# Patient Record
Sex: Female | Born: 1955 | Race: Black or African American | Hispanic: No | Marital: Single | Smoking: Never smoker
Health system: Southern US, Community
[De-identification: ages and names within clinical notes are randomized; demographics above are authoritative.]

## PROBLEM LIST (undated history)

## (undated) DIAGNOSIS — M1990 Unspecified osteoarthritis, unspecified site: Secondary | ICD-10-CM

## (undated) DIAGNOSIS — I10 Essential (primary) hypertension: Secondary | ICD-10-CM

## (undated) DIAGNOSIS — E079 Disorder of thyroid, unspecified: Secondary | ICD-10-CM

## (undated) DIAGNOSIS — E119 Type 2 diabetes mellitus without complications: Secondary | ICD-10-CM

---

## 2018-01-28 ENCOUNTER — Encounter (HOSPITAL_COMMUNITY): Payer: Self-pay

## 2018-01-28 DIAGNOSIS — E119 Type 2 diabetes mellitus without complications: Secondary | ICD-10-CM | POA: Diagnosis not present

## 2018-01-28 DIAGNOSIS — I10 Essential (primary) hypertension: Secondary | ICD-10-CM | POA: Insufficient documentation

## 2018-01-28 DIAGNOSIS — R0789 Other chest pain: Secondary | ICD-10-CM | POA: Insufficient documentation

## 2018-01-28 DIAGNOSIS — M5412 Radiculopathy, cervical region: Secondary | ICD-10-CM | POA: Insufficient documentation

## 2018-01-29 ENCOUNTER — Emergency Department (HOSPITAL_COMMUNITY): Payer: Medicare Other

## 2018-01-29 ENCOUNTER — Emergency Department (HOSPITAL_COMMUNITY)
Admission: EM | Admit: 2018-01-29 | Discharge: 2018-01-29 | Disposition: A | Discharge status: Home / Self Care | Payer: Medicare Other | Attending: Emergency Medicine | Admitting: Emergency Medicine

## 2018-01-29 ENCOUNTER — Other Ambulatory Visit: Payer: Self-pay

## 2018-01-29 ENCOUNTER — Encounter (HOSPITAL_COMMUNITY): Payer: Self-pay

## 2018-01-29 DIAGNOSIS — R0789 Other chest pain: Secondary | ICD-10-CM

## 2018-01-29 DIAGNOSIS — M5412 Radiculopathy, cervical region: Secondary | ICD-10-CM

## 2018-01-29 HISTORY — DX: Unspecified osteoarthritis, unspecified site: M19.90

## 2018-01-29 HISTORY — DX: Disorder of thyroid, unspecified: E07.9

## 2018-01-29 HISTORY — DX: Essential (primary) hypertension: I10

## 2018-01-29 HISTORY — DX: Type 2 diabetes mellitus without complications: E11.9

## 2018-01-29 LAB — COMPREHENSIVE METABOLIC PANEL
ALK PHOS: 57 U/L (ref 38–126)
ALT: 20 U/L (ref 0–44)
AST: 20 U/L (ref 15–41)
Albumin: 4 g/dL (ref 3.5–5.0)
Anion gap: 11 (ref 5–15)
BUN: 23 mg/dL — ABNORMAL HIGH (ref 6–20)
CALCIUM: 8.8 mg/dL — AB (ref 8.9–10.3)
CO2: 28 mmol/L (ref 22–32)
Chloride: 102 mmol/L (ref 98–111)
Creatinine, Ser: 1.05 mg/dL — ABNORMAL HIGH (ref 0.44–1.00)
GFR calc Af Amer: >60 mL/min (ref >60)
GFR calc non Af Amer: 58 mL/min — ABNORMAL LOW (ref >60)
Glucose, Bld: 176 mg/dL — ABNORMAL HIGH (ref 70–99)
Potassium: 3.5 mmol/L (ref 3.5–5.1)
Sodium: 141 mmol/L (ref 135–145)
Total Bilirubin: 0.6 mg/dL (ref 0.3–1.2)
Total Protein: 7.2 g/dL (ref 6.5–8.1)

## 2018-01-29 LAB — CBC WITH DIFFERENTIAL/PLATELET
Abs Immature Granulocytes: 0.04 10*3/uL (ref 0.00–0.07)
Basophils Absolute: 0 10*3/uL (ref 0.0–0.1)
Basophils Relative: 0 %
Eosinophils Absolute: 0 10*3/uL (ref 0.0–0.5)
Eosinophils Relative: 0 %
HEMATOCRIT: 38.7 % (ref 36.0–46.0)
HEMOGLOBIN: 12.3 g/dL (ref 12.0–15.0)
Immature Granulocytes: 0 %
LYMPHS PCT: 19 %
Lymphs Abs: 1.8 10*3/uL (ref 0.7–4.0)
MCH: 29.5 pg (ref 26.0–34.0)
MCHC: 31.8 g/dL (ref 30.0–36.0)
MCV: 92.8 fL (ref 80.0–100.0)
Monocytes Absolute: 0.4 10*3/uL (ref 0.1–1.0)
Monocytes Relative: 4 %
Neutro Abs: 7.3 10*3/uL (ref 1.7–7.7)
Neutrophils Relative %: 77 %
Platelets: 279 10*3/uL (ref 150–400)
RBC: 4.17 MIL/uL (ref 3.87–5.11)
RDW: 13.2 % (ref 11.5–15.5)
WBC: 9.5 10*3/uL (ref 4.0–10.5)
nRBC: 0 % (ref 0.0–0.2)

## 2018-01-29 LAB — D-DIMER, QUANTITATIVE: D-Dimer, Quant: 0.71 ug/mL-FEU — ABNORMAL HIGH (ref 0.00–0.50)

## 2018-01-29 LAB — TROPONIN I
Troponin I: <0.03 ng/mL (ref <0.03)
Troponin I: <0.03 ng/mL (ref <0.03)

## 2018-01-29 MED ORDER — METHOCARBAMOL 500 MG PO TABS
1000.0000 mg | ORAL_TABLET | Freq: Once | ORAL | Status: AC
  Administered 2018-01-29: 1000 mg via ORAL
  Filled 2018-01-29: qty 2

## 2018-01-29 MED ORDER — DEXAMETHASONE SODIUM PHOSPHATE 10 MG/ML IJ SOLN
10.0000 mg | Freq: Once | INTRAMUSCULAR | Status: AC
  Administered 2018-01-29: 10 mg via INTRAVENOUS
  Filled 2018-01-29: qty 1

## 2018-01-29 MED ORDER — METHOCARBAMOL 500 MG PO TABS: 500.0000 mg | ORAL_TABLET | Freq: Four times a day (QID) | ORAL | 0 refills | Status: DC | PRN

## 2018-01-29 MED ORDER — OXYCODONE-ACETAMINOPHEN 5-325 MG PO TABS
1.0000 | ORAL_TABLET | Freq: Once | ORAL | Status: AC
  Administered 2018-01-29: 1 via ORAL
  Filled 2018-01-29: qty 1

## 2018-01-29 MED ORDER — METHYLPREDNISOLONE 4 MG PO TBPK: ORAL_TABLET | ORAL | 0 refills | Status: DC

## 2018-01-29 MED ORDER — IOPAMIDOL (ISOVUE-370) INJECTION 76%
100.0000 mL | Freq: Once | INTRAVENOUS | Status: AC | PRN
  Administered 2018-01-29: 100 mL via INTRAVENOUS

## 2018-01-29 MED ORDER — HYDROMORPHONE HCL 1 MG/ML IJ SOLN
0.5000 mg | Freq: Once | INTRAMUSCULAR | Status: AC
  Administered 2018-01-29: 0.5 mg via INTRAVENOUS
  Filled 2018-01-29: qty 1

## 2018-01-29 MED ORDER — IOPAMIDOL (ISOVUE-370) INJECTION 76%
INTRAVENOUS | Status: AC
  Filled 2018-01-29: qty 100

## 2018-01-29 MED ORDER — SODIUM CHLORIDE (PF) 0.9 % IJ SOLN
INTRAMUSCULAR | Status: AC
  Filled 2018-01-29: qty 50

## 2018-01-29 NOTE — ED Notes (Signed)
Patient transported to CT 

## 2018-01-29 NOTE — Discharge Instructions (Signed)
As we discussed your pain seems to be coming from a pinched nerve in your neck.  You declined MRI today. You should follow-up with your primary doctor and neurosurgeon when you get home.  Watch her blood sugars carefully while you are taking the steroids.  Return to the ED with worsening pain, weakness, numbness or any other concerns.

## 2018-01-29 NOTE — ED Provider Notes (Signed)
[facility]-EMERGENCY DEPT Provider Note   CSN: [id number] Arrival date & time: 01/28/18  2056     History   Chief Complaint Chief Complaint  Patient presents with  . Arm Pain    HPI Sandra Reese is a 62 y.o. female.  Patient visiting from out of town.  She has a history of diabetes, hypertension, hypothyroidism, spinal stenosis status post ACDF in 2003.  She presents with right-sided chest pain and arm pain.  This onset today around 1 PM when she woke from sleep.  She wonders if she slept on it wrong.  She was not having any pain when she went to bed.  She is been taking muscle relaxers, muscle rubs and a pain pill without relief.  The pain is worse with palpation and movement.  She denies any weakness in her arm.  She does have some tingling in her second and third fingers.  There is pain with range of motion of her neck turning to the right as well as looking down towards her chest.  She denies any fevers, chills, nausea, vomiting.  No bowel or bladder incontinence.  She reports the pain is been constant and is not improving.  States she normally does not have any radicular type pain in her arm.  The history is provided by the patient.  Arm Pain  Associated symptoms include chest pain. Pertinent negatives include no abdominal pain, no headaches and no shortness of breath.    Past Medical History:  Diagnosis Date  . Degenerative arthritis   . Diabetes mellitus without complication (HCC)   . Hypertension   . Thyroid disease     There are no active problems to display for this patient.   History reviewed. No pertinent surgical history.   OB History   No obstetric history on file.      Home Medications    Prior to Admission medications   Not on File    Family History History reviewed. No pertinent family history.  Social History Social History   Tobacco Use  . Smoking status: Never Smoker  . Smokeless tobacco: Never Used  Substance Use  Topics  . Alcohol use: Never    Frequency: Never  . Drug use: Never     Allergies   Patient has no allergy information on record.   Review of Systems Review of Systems  Constitutional: Negative for activity change, appetite change and fever.  HENT: Negative for congestion and sinus pain.   Eyes: Negative for visual disturbance.  Respiratory: Negative for chest tightness and shortness of breath.   Cardiovascular: Positive for chest pain.  Gastrointestinal: Negative for abdominal pain, nausea and vomiting.  Genitourinary: Negative for vaginal discharge.  Musculoskeletal: Positive for arthralgias, myalgias and neck pain.  Neurological: Negative for dizziness, tremors, weakness, light-headedness, numbness and headaches.    all other systems are negative except as noted in the HPI and PMH.    Physical Exam Updated Vital Signs BP (!) 169/83 (BP Location: Left Arm)   Pulse 74   Temp 98.3 F (36.8 C) (Oral)   Resp 14   Ht 5\' 6"  (1.676 m)   Wt 82.6 kg   SpO2 100%   BMI 29.38 kg/m   Physical Exam Vitals signs and nursing note reviewed.  Constitutional:      General: She is not in acute distress.    Appearance: She is well-developed.  HENT:     Head: Normocephalic and atraumatic.     Mouth/Throat:  Pharynx: No oropharyngeal exudate.  Eyes:     Conjunctiva/sclera: Conjunctivae normal.     Pupils: Pupils are equal, round, and reactive to light.  Neck:     Musculoskeletal: Normal range of motion and neck supple. Muscular tenderness present.     Comments: Right paraspinal cervical tenderness and pain with range of motion of her neck Cardiovascular:     Rate and Rhythm: Normal rate and regular rhythm.     Heart sounds: Normal heart sounds. No murmur.  Pulmonary:     Effort: Pulmonary effort is normal. No respiratory distress.     Breath sounds: Normal breath sounds.     Comments: Reproducible right chest wall tenderness Chest:     Chest wall: Tenderness present.    Abdominal:     Palpations: Abdomen is soft.     Tenderness: There is no abdominal tenderness. There is no guarding or rebound.  Musculoskeletal: Normal range of motion.        General: No tenderness.  Skin:    General: Skin is warm.  Neurological:     Mental Status: She is alert and oriented to person, place, and time.     Cranial Nerves: No cranial nerve deficit.     Motor: No abnormal muscle tone.     Coordination: Coordination normal.     Comments:  5/5 strength throughout. CN 2-12 intact.Equal grip strength.  Slightly decreased grip strength on R likely due to pain. equal flexion extension of forearms bilaterally. Cardinal hand movements intact. Intact radial pulse bilaterally Subjective paresthesias in tips of second and third fingers  Psychiatric:        Behavior: Behavior normal.      ED Treatments / Results  Labs (all labs ordered are listed, but only abnormal results are displayed) Labs Reviewed  COMPREHENSIVE METABOLIC PANEL - Abnormal; Notable for the following components:      Result Value   Glucose, Bld 176 (*)    BUN 23 (*)    Creatinine, Ser 1.05 (*)    Calcium 8.8 (*)    GFR calc non Af Amer 58 (*)    All other components within normal limits  D-DIMER, QUANTITATIVE (NOT AT [facility]) - Abnormal; Notable for the following components:   D-Dimer, Quant 0.71 (*)    All other components within normal limits  CBC WITH DIFFERENTIAL/PLATELET  TROPONIN I  TROPONIN I    EKG EKG Interpretation  Date/Time:  Friday January 29 2018 02:00:45 EST Ventricular Rate:  69 PR Interval:    QRS Duration: 96 QT Interval:  428 QTC Calculation: 459 R Axis:   -16 Text Interpretation:  Sinus rhythm Borderline left axis deviation Low voltage, precordial leads Borderline T abnormalities, anterior leads No previous ECGs available Confirmed by Glynn Octave [phone]) on 01/29/2018 2:10:23 AM Also confirmed by Glynn Octave [phone]), editor Barbette Hair [phone])  on 01/29/2018  7:29:54 AM   Radiology Ct Angio Chest Pe W And/or Wo Contrast  Result Date: 01/29/2018 CLINICAL DATA:  Acute onset of worsening right neck and arm pain. Elevated D-dimer. EXAM: CT ANGIOGRAPHY CHEST WITH CONTRAST TECHNIQUE: Multidetector CT imaging of the chest was performed using the standard protocol during bolus administration of intravenous contrast. Multiplanar CT image reconstructions and MIPs were obtained to evaluate the vascular anatomy. CONTRAST:  ISOVUE-370 IOPAMIDOL (ISOVUE-370) INJECTION 76% COMPARISON:  None. FINDINGS: Cardiovascular:  There is no evidence of pulmonary embolus. The heart is normal in size. The thoracic aorta is grossly unremarkable. The great vessels are within normal  limits. Mediastinum/Nodes: The mediastinum is grossly unremarkable in appearance. No mediastinal lymphadenopathy is seen. No pericardial effusion is identified. The thyroid gland is not characterized on this study. No axillary lymphadenopathy is seen. Lungs/Pleura: The lungs are clear bilaterally. No focal consolidation, pleural effusion or pneumothorax is seen. No masses are identified. Upper Abdomen: The visualized portions of the liver and spleen are unremarkable. The visualized portions of the pancreas, adrenal glands and kidneys are within normal limits. Musculoskeletal: No acute osseous abnormalities are identified. Cervical spinal fusion hardware is partially imaged. The visualized musculature is unremarkable in appearance. Review of the MIP images confirms the above findings. IMPRESSION: 1. No evidence of pulmonary embolus. 2. Lungs clear bilaterally. Electronically Signed   By: Roanna RaiderJeffery  Chang M.D.   On: 01/29/2018 04:49   Ct Cervical Spine Wo Contrast  Result Date: 01/29/2018 CLINICAL DATA:  Chronic back pain. EXAM: CT CERVICAL SPINE WITHOUT CONTRAST TECHNIQUE: Multidetector CT imaging of the cervical spine was performed without intravenous contrast. Multiplanar CT image reconstructions were also  generated. COMPARISON:  None. FINDINGS: Alignment: Normal. Skull base and vertebrae: No acute fracture. Status post anterior cervical spinal fusion at C5-C6. No primary bone lesion or focal pathologic process. Soft tissues and spinal canal: No prevertebral fluid or swelling. No visible canal hematoma. Disc levels: Intervertebral disc spaces are otherwise grossly preserved. Scattered anterior disc osteophyte complexes are seen along the lower cervical spine. Facet disease is noted at the upper and mid cervical spine. Upper chest: The visualized lung apices are clear. The thyroid is not well characterized. Other: No additional soft tissue abnormalities are seen. IMPRESSION: 1. No evidence of fracture or subluxation along the cervical spine. 2. Status post anterior cervical spinal fusion at C5-C6. Electronically Signed   By: Roanna RaiderJeffery  Chang M.D.   On: 01/29/2018 02:52    Procedures Procedures (including critical care time)  Medications Ordered in ED Medications  oxyCODONE-acetaminophen (PERCOCET/ROXICET) 5-325 MG per tablet 1 tablet (has no administration in time range)     Initial Impression / Assessment and Plan / ED Course  I have reviewed the triage vital signs and the nursing notes.  Pertinent labs & imaging results that were available during my care of the patient were reviewed by me and considered in my medical decision making (see chart for details).    Patient with right-sided chest and arm pain since about 1 PM and is been constant.  Mildly decreased strength in right hand due to pain.  Subjective tingling in her second and third digits.  History of C-spine surgery remotely.  MRI 07/09/17 IMPRESSION: 1. ACDF AT C5-6. 2. MINIMAL ANTEROLISTHESIS AT C3-4. BROAD CENTRAL DISC PROTRUSION WITH SLIGHT VENTRAL CORD FLATTENING AT THIS LEVEL. 3. BROAD CENTRAL/RIGHT PARACENTRAL DISC PROTRUSION WITH MILD RIGHT VENTRAL CORD FLATTENING AT C4-5. MILD RIGHT-SIDED FORAMINAL STENOSIS AT THIS LEVEL. 4.  BROAD DISC/OSTEOPHYTE COMPLEX WITH VENTRAL CORD FLATTENING AT C6-7. MILD RIGHT-SIDED FORAMINAL STENOSIS AT THIS LEVEL.   Suspect MSK chest pain, and likely cervical radiculopathy. EKG with T wave changes, no comparison. Troponin negative.   CT C spine today nonacute. Troponin negative x2.  Pain improved in the ED with improved grip strength on R and now symmetric upper extremity strength. Suspect cervical radiculopathy. MRI not available, patient offered transfer for MRI and declines. Low suspicion for cord compression. She has good strength and sensation in her arm. No fever or other red flags.  States she will followup with her doctors for MRI.Will treat with course of steroids and muscle relaxers. Advised  to monitor blood sugars closely while on steroids. Return precautions discussed.  Final Clinical Impressions(s) / ED Diagnoses   Final diagnoses:  Cervical radiculopathy  Atypical chest pain    ED Discharge Orders    None       Toris Laverdiere, Jeannett SeniorStephen, MD 01/29/18 [phone]

## 2019-11-29 IMAGING — CT CT ANGIO CHEST
2 of 7 series · 18 of 46 positions shown · IV contrast (ISOVUE 370)
Comparison: None.

CLINICAL DATA: Acute onset of worsening right neck and arm pain.
Elevated D-dimer.

EXAM:
CT ANGIOGRAPHY CHEST WITH CONTRAST
TECHNIQUE: Multidetector CT imaging of the chest was performed using the
standard protocol during bolus administration of intravenous
contrast. Multiplanar CT image reconstructions and MIPs were
obtained to evaluate the vascular anatomy.
CONTRAST:  100mL FWJ23C-PBM IOPAMIDOL (FWJ23C-PBM) INJECTION 76%

[Series 5: thins · axial · 0.62mm/px · z∈[+1524,+1736]mm · 15 of 241 slices shown]
[im 15/241  lung]
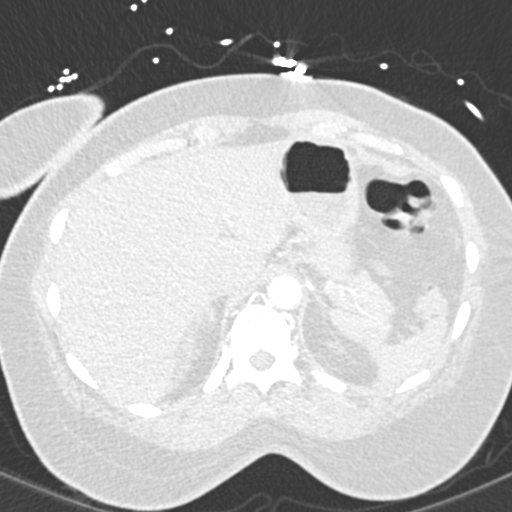
[im 29/241  soft-tissue]
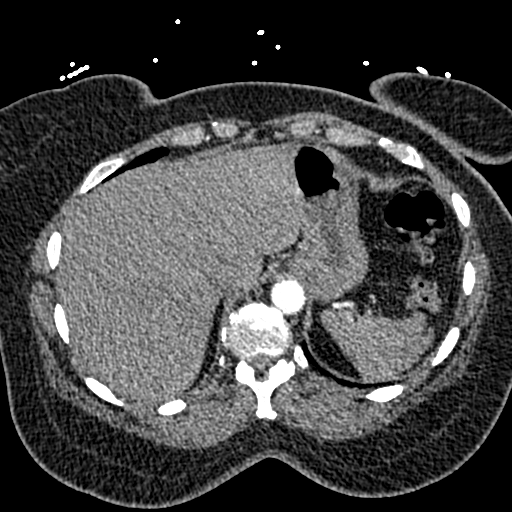
[im 43/241  lung]
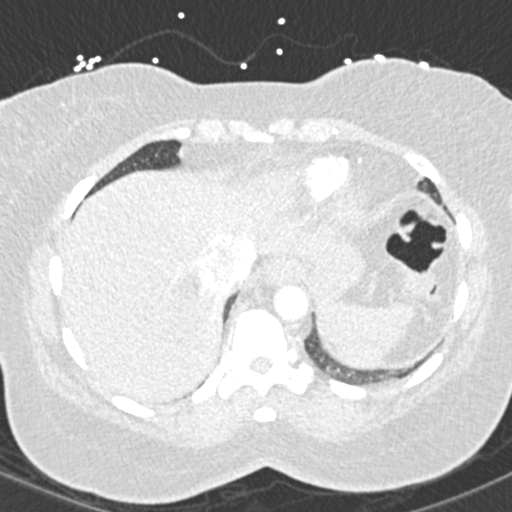
[im 57/241  soft-tissue]
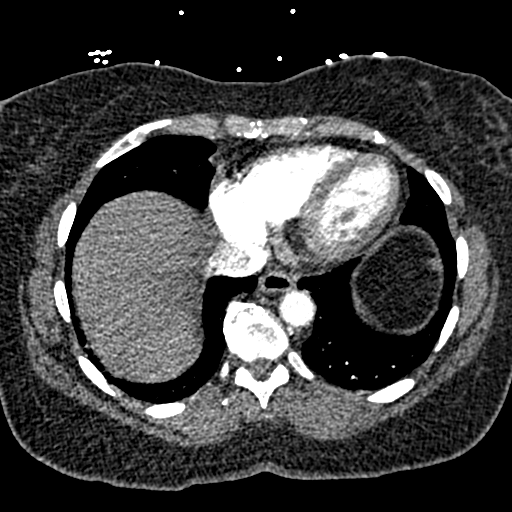
[im 71/241  lung]
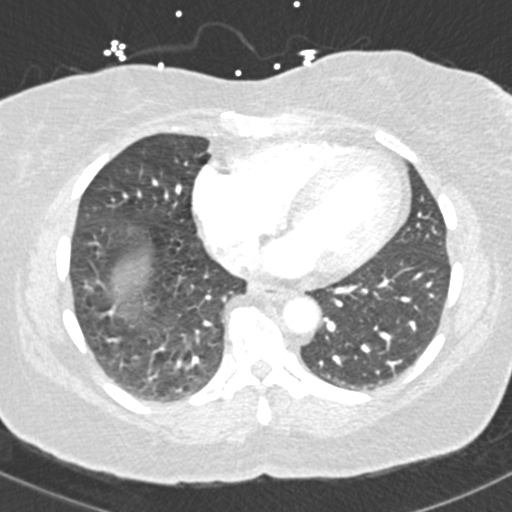
[im 85/241  soft-tissue]
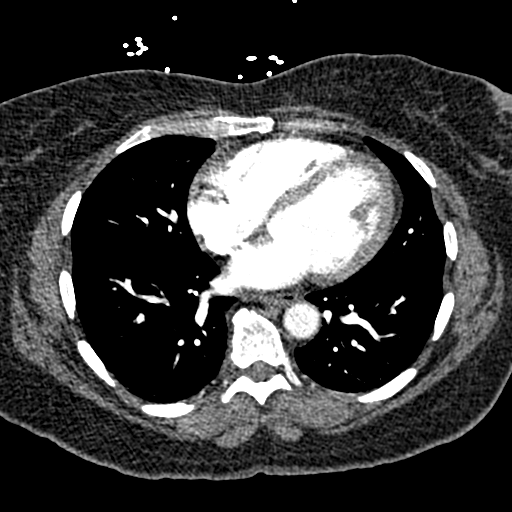
[im 99/241  lung]
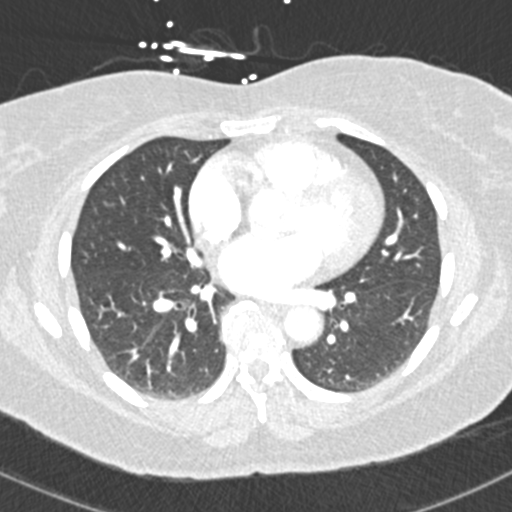
[im 128/241  soft-tissue]
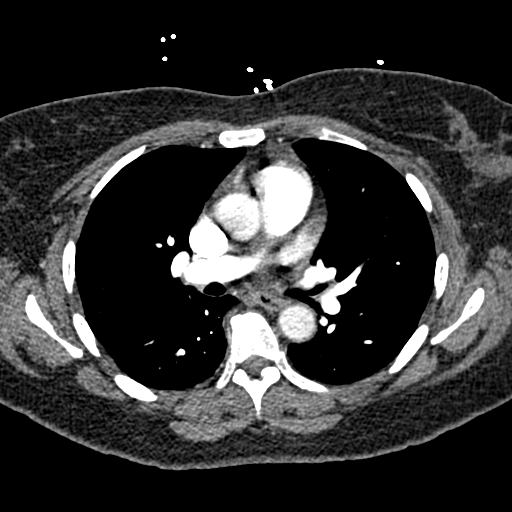
[im 142/241  lung]
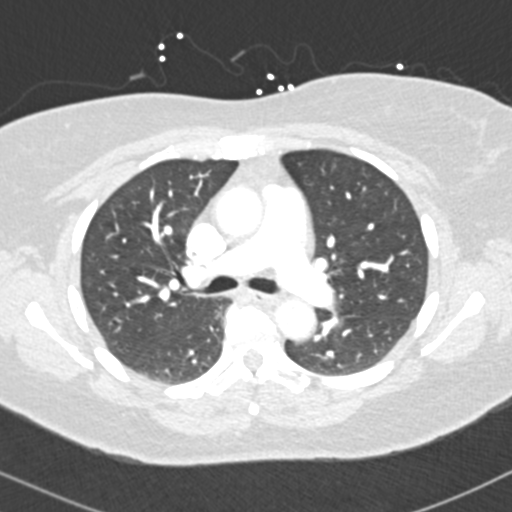
[im 156/241  soft-tissue]
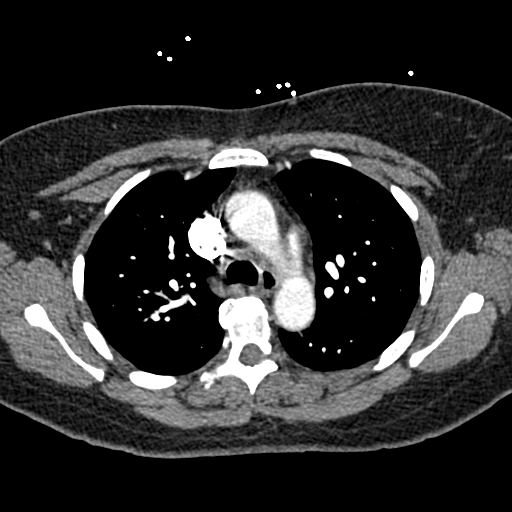
[im 170/241  lung]
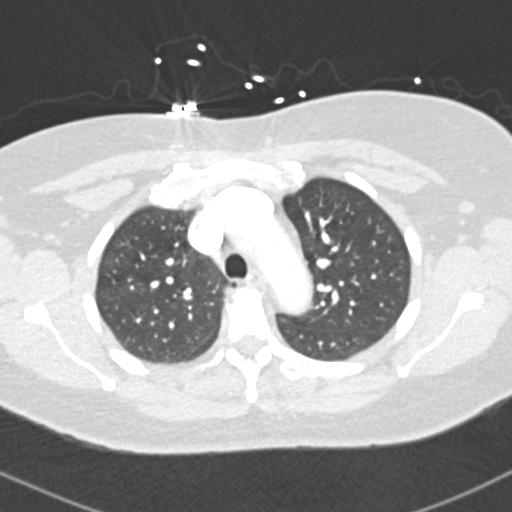
[im 184/241  soft-tissue]
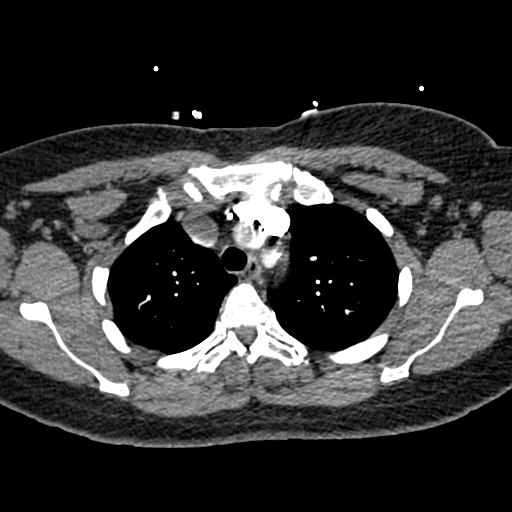
[im 198/241  lung]
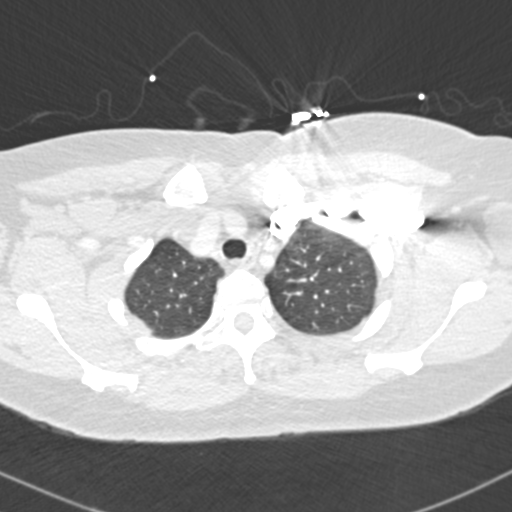
[im 212/241  soft-tissue]
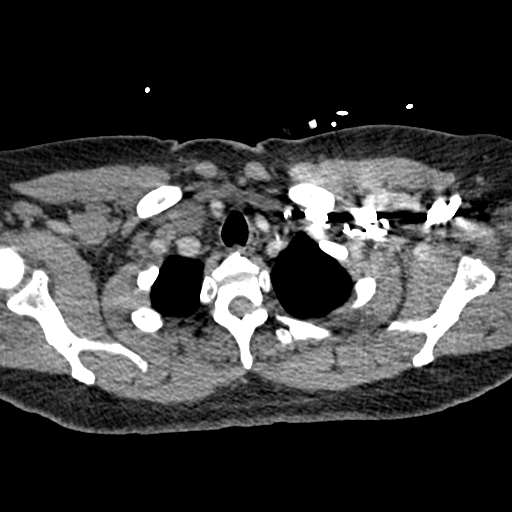
[im 226/241  lung]
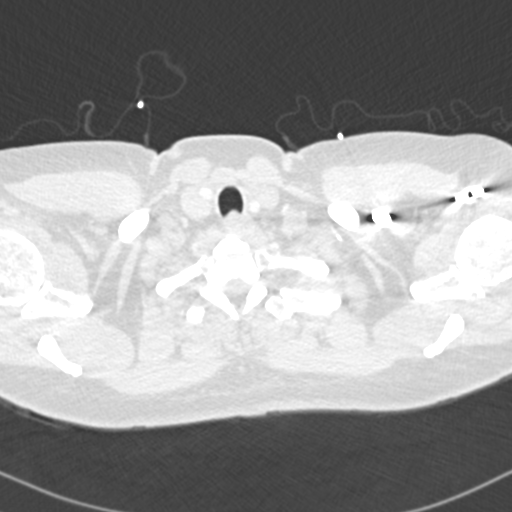

[Series 7: coronal mpr · coronal · 0.47mm/px · 3 of 111 slices shown]
[im 28/111  soft-tissue]
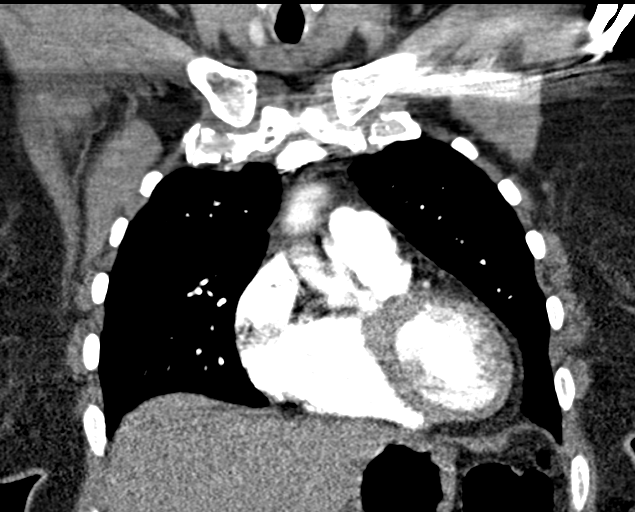
[im 56/111  soft-tissue]
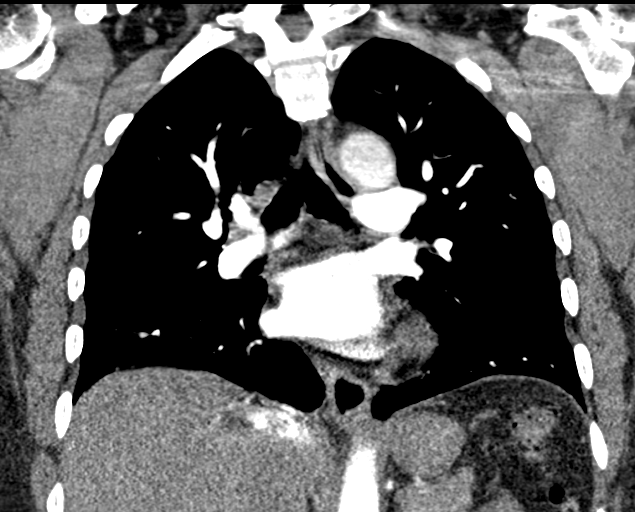
[im 83/111  soft-tissue]
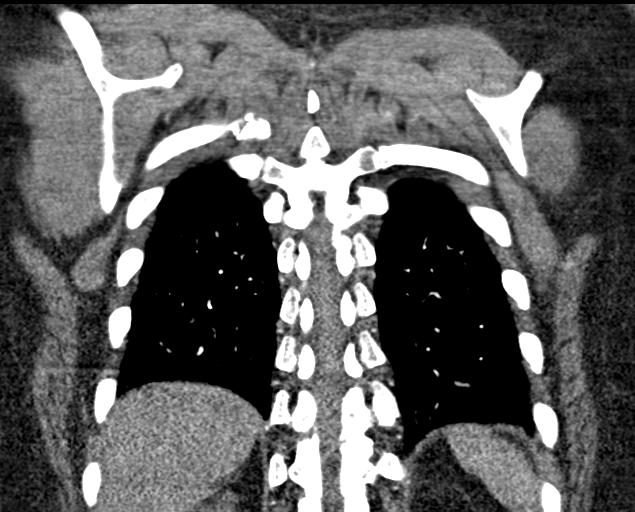

[18 of 46 positions shown; findings below may reference images not displayed]

FINDINGS: Cardiovascular:  There is no evidence of pulmonary embolus.

The heart is normal in size. The thoracic aorta is grossly
unremarkable. The great vessels are within normal limits.

Mediastinum/Nodes: The mediastinum is grossly unremarkable in
appearance. No mediastinal lymphadenopathy is seen. No pericardial
effusion is identified. The thyroid gland is not characterized on
this study. No axillary lymphadenopathy is seen.

Lungs/Pleura: The lungs are clear bilaterally. No focal
consolidation, pleural effusion or pneumothorax is seen. No masses
are identified.

Upper Abdomen: The visualized portions of the liver and spleen are
unremarkable. The visualized portions of the pancreas, adrenal
glands and kidneys are within normal limits.

Musculoskeletal: No acute osseous abnormalities are identified.
Cervical spinal fusion hardware is partially imaged. The visualized
musculature is unremarkable in appearance.

Review of the MIP images confirms the above findings.
IMPRESSION: 1. No evidence of pulmonary embolus.
2. Lungs clear bilaterally.

## 2023-01-29 ENCOUNTER — Ambulatory Visit
Admission: RE | Admit: 2023-01-29 | Discharge: 2023-01-29 | Disposition: A | Discharge status: Home / Self Care | Payer: Medicare PPO | Source: Ambulatory Visit

## 2023-01-29 VITALS — BP 118/75 | HR 71 | Temp 97.6°F | Resp 18

## 2023-01-29 DIAGNOSIS — E1142 Type 2 diabetes mellitus with diabetic polyneuropathy: Secondary | ICD-10-CM

## 2023-01-29 DIAGNOSIS — Z20828 Contact with and (suspected) exposure to other viral communicable diseases: Secondary | ICD-10-CM

## 2023-01-29 LAB — POCT INFLUENZA A/B
Influenza A, POC: NEGATIVE
Influenza B, POC: NEGATIVE

## 2023-01-29 MED ORDER — OSELTAMIVIR PHOSPHATE 75 MG PO CAPS: 75.0000 mg | ORAL_CAPSULE | Freq: Two times a day (BID) | ORAL | 0 refills | Status: AC

## 2023-01-29 NOTE — ED Provider Notes (Signed)
[facility]    CSN: [id number] Arrival date & time: 01/29/23  1351    HISTORY   Chief Complaint  Patient presents with   Leg Pain    Entered by patient   HPI Sandra Reese is a pleasant, 67 y.o. female who presents to urgent care today. Patient complains of nerve pain in both of her lower legs that is worse at bedtime.  Patient states she is a diabetic.  Patient states has been taking Tylenol but it does not help.  Patient states this began about a week ago.  EMR reviewed, patient has been prescribed Percocet and Lyrica for similar complaints, both last filled earlier this month.  Patient states she has not been taking either 1 because they make her very sleepy.  Patient also complains of exposure to influenza A from a member of her household, denies symptoms of influenza at this time.  The history is provided by the patient.   Past Medical History:  Diagnosis Date   Degenerative arthritis    Diabetes mellitus without complication (HCC)    Hypertension    Thyroid disease    There are no active problems to display for this patient.  History reviewed. No pertinent surgical history. OB History   No obstetric history on file.    Home Medications    Prior to Admission medications   Medication Sig Start Date End Date Taking? Authorizing Provider  estradiol (ESTRACE) 1 MG tablet Take 1 mg by mouth daily. 11/27/22  Yes [provider]  oseltamivir (TAMIFLU) 75 MG capsule Take 1 capsule (75 mg total) by mouth every 12 (twelve) hours for 10 days. 01/29/23 02/08/23 Yes Theadora Rama Scales, PA-C  OZEMPIC, 1 MG/DOSE, 4 MG/3ML SOPN inject 1mg  Subcutaneous Once a Week for 90 days 01/06/22 02/25/23 Yes [provider]  atenolol (TENORMIN) 50 MG tablet Take 50 mg by mouth as directed. Every other day    [provider]  atenolol-chlorthalidone (TENORETIC) 50-25 MG tablet Take 1 tablet by mouth as directed. Every other day    [provider]   levothyroxine (SYNTHROID, LEVOTHROID) 175 MCG tablet Take 175 mcg by mouth daily before breakfast.    [provider]  oxyCODONE-acetaminophen (PERCOCET) 10-325 MG tablet Take 1 tablet by mouth every 6 (six) hours as needed for pain.     [provider]  pregabalin (LYRICA) 150 MG capsule Take 150 mg by mouth 3 (three) times daily.    [provider]  rosuvastatin (CRESTOR) 10 MG tablet Take 10 mg by mouth 2 (two) times a week.    [provider]  traZODone (DESYREL) 50 MG tablet Take 50-100 mg by mouth at bedtime.    [provider]    Family History History reviewed. No pertinent family history. Social History Social History   Tobacco Use   Smoking status: Never   Smokeless tobacco: Never  Substance Use Topics   Alcohol use: Never   Drug use: Never   Allergies   Lisinopril, Latex, and Metronidazole  Review of Systems Review of Systems Pertinent findings revealed after performing a 14 point review of systems has been noted in the history of present illness.  Physical Exam Vital Signs BP 118/75 (BP Location: Right Arm)   Pulse 71   Temp 97.6 F (36.4 C) (Oral)   Resp 18   SpO2 98%   No data found.  Physical Exam Vitals and nursing note reviewed.  Constitutional:      General: She is not  in acute distress.    Appearance: Normal appearance. She is not ill-appearing.  HENT:     Head: Normocephalic and atraumatic.     Salivary Glands: Right salivary gland is not diffusely enlarged or tender. Left salivary gland is not diffusely enlarged or tender.     Right Ear: Tympanic membrane, ear canal and external ear normal. No drainage. No middle ear effusion. There is no impacted cerumen. Tympanic membrane is not erythematous or bulging.     Left Ear: Tympanic membrane, ear canal and external ear normal. No drainage.  No middle ear effusion. There is no impacted cerumen. Tympanic membrane is not erythematous or bulging.     Nose: Nose  normal. No nasal deformity, septal deviation, mucosal edema, congestion or rhinorrhea.     Right Turbinates: Not enlarged, swollen or pale.     Left Turbinates: Not enlarged, swollen or pale.     Right Sinus: No maxillary sinus tenderness or frontal sinus tenderness.     Left Sinus: No maxillary sinus tenderness or frontal sinus tenderness.     Mouth/Throat:     Lips: Pink. No lesions.     Mouth: Mucous membranes are moist. No oral lesions.     Pharynx: Oropharynx is clear. Uvula midline. No posterior oropharyngeal erythema or uvula swelling.     Tonsils: No tonsillar exudate. 0 on the right. 0 on the left.  Eyes:     General: Lids are normal.        Right eye: No discharge.        Left eye: No discharge.     Extraocular Movements: Extraocular movements intact.     Conjunctiva/sclera: Conjunctivae normal.     Right eye: Right conjunctiva is not injected.     Left eye: Left conjunctiva is not injected.  Neck:     Trachea: Trachea and phonation normal.  Cardiovascular:     Rate and Rhythm: Normal rate and regular rhythm.     Pulses: Normal pulses.     Heart sounds: Normal heart sounds. No murmur heard.    No friction rub. No gallop.  Pulmonary:     Effort: Pulmonary effort is normal. No accessory muscle usage, prolonged expiration or respiratory distress.     Breath sounds: Normal breath sounds. No stridor, decreased air movement or transmitted upper airway sounds. No decreased breath sounds, wheezing, rhonchi or rales.  Chest:     Chest wall: No tenderness.  Musculoskeletal:        General: No swelling, tenderness, deformity or signs of injury. Normal range of motion.     Cervical back: Normal range of motion and neck supple. Normal range of motion.     Right lower leg: No edema.     Left lower leg: No edema.  Lymphadenopathy:     Cervical: No cervical adenopathy.  Skin:    General: Skin is warm and dry.     Findings: No erythema or rash.  Neurological:     General: No focal  deficit present.     Mental Status: She is alert and oriented to person, place, and time.  Psychiatric:        Mood and Affect: Mood normal.        Behavior: Behavior normal.     Visual Acuity Right Eye Distance:   Left Eye Distance:   Bilateral Distance:    Right Eye Near:   Left Eye Near:    Bilateral Near:     UC Couse / Diagnostics / Procedures:  Radiology No results found.  Procedures Procedures (including critical care time) EKG  Pending results:  Labs Reviewed  POCT INFLUENZA A/B - Normal    Medications Ordered in UC: Medications - No data to display  UC Diagnoses / Final Clinical Impressions(s)   I have reviewed the triage vital signs and the nursing notes.  Pertinent labs & imaging results that were available during my care of the patient were reviewed by me and considered in my medical decision making (see chart for details).    Final diagnoses:  Exposure to influenza  Diabetic peripheral neuropathy (HCC)   Influenza test today is negative.  Due to patient having ongoing exposure to family member with influenza A, recommend 10-day course of Tamiflu.  Patient advised that if she develops symptoms of influenza, she would need only complete 5 days of Tamiflu from the date of symptom onset.  Patient advised to resume Lyrica as needed for lower extremity pain and to add Percocet as needed for pain not relieved by Lyrica.  Conservative care recommended.  Return precautions advised.  Please see discharge instructions below for details of plan of care as provided to patient. ED Prescriptions     Medication Sig Dispense Auth. Provider   oseltamivir (TAMIFLU) 75 MG capsule Take 1 capsule (75 mg total) by mouth every 12 (twelve) hours for 10 days. 20 capsule Theadora Rama Scales, PA-C      I have reviewed the PDMP during this encounter.  Pending results:  Labs Reviewed  POCT INFLUENZA A/B - Normal      Discharge Instructions      Please resume  Lyrica at bedtime to treat your leg pain.  Your rapid influenza antigen test today was negative.   To prevent you from developing influenza infection caused by known exposure to influenza A, please begin taking Tamiflu twice daily.  If you develop symptoms of influenza, you will only need to take the Tamiflu for 5 full days after the day your symptoms begin.  If you do not develop symptoms of influenza, please take Tamiflu for the full 10 days as prescribed.  Other members of your household, whether they are feeling ill or not, should also be tested and treated for influenza as well.  We are happy to see them here at this location where they can follow-up with her regular providers.  Thank you for visiting [facility] today.  We appreciate the opportunity to participate in your care.       Disposition Upon Discharge:  Condition: stable for discharge home  Patient presented with an acute illness with associated systemic symptoms and significant discomfort requiring urgent management. In my opinion, this is a condition that a prudent lay person (someone who possesses an average knowledge of health and medicine) may potentially expect to result in complications if not addressed urgently such as respiratory distress, impairment of bodily function or dysfunction of bodily organs.   Routine symptom specific, illness specific and/or disease specific instructions were discussed with the patient and/or caregiver at length.   As such, the patient has been evaluated and assessed, work-up was performed and treatment was provided in alignment with urgent care protocols and evidence based medicine.  Patient/parent/caregiver has been advised that the patient may require follow up for further testing and treatment if the symptoms continue in spite of treatment, as clinically indicated and appropriate.  Patient/parent/caregiver has been advised to return to the [facility] or PCP if no better; to PCP or  the Emergency  Department if new signs and symptoms develop, or if the current signs or symptoms continue to change or worsen for further workup, evaluation and treatment as clinically indicated and appropriate  The patient will follow up with their current PCP if and as advised. If the patient does not currently have a PCP we will assist them in obtaining one.   The patient may need specialty follow up if the symptoms continue, in spite of conservative treatment and management, for further workup, evaluation, consultation and treatment as clinically indicated and appropriate.  Patient/parent/caregiver verbalized understanding and agreement of plan as discussed.  All questions were addressed during visit.  Please see discharge instructions below for further details of plan.  This office note has been dictated using Teaching laboratory technician.  Unfortunately, this method of dictation can sometimes lead to typographical or grammatical errors.  I apologize for your inconvenience in advance if this occurs.  Please do not hesitate to reach out to me if clarification is needed.      Theadora Rama Scales, PA-C 01/29/23 1539

## 2023-01-29 NOTE — ED Triage Notes (Signed)
Pt c/o bilateral lower extremity nerve pain that is worse at bedtime. Patient states she is a diabetic.   Home interventions: tylenol  Started: about a week ago

## 2023-01-29 NOTE — Discharge Instructions (Addendum)
Please resume Lyrica at bedtime to treat your leg pain.  Your rapid influenza antigen test today was negative.   To prevent you from developing influenza infection caused by known exposure to influenza A, please begin taking Tamiflu twice daily.  If you develop symptoms of influenza, you will only need to take the Tamiflu for 5 full days after the day your symptoms begin.  If you do not develop symptoms of influenza, please take Tamiflu for the full 10 days as prescribed.  Other members of your household, whether they are feeling ill or not, should also be tested and treated for influenza as well.  We are happy to see them here at this location where they can follow-up with her regular providers.  Thank you for visiting [facility] today.  We appreciate the opportunity to participate in your care.

## 2023-11-04 ENCOUNTER — Ambulatory Visit: Admitting: Radiology

## 2023-11-04 ENCOUNTER — Ambulatory Visit
Admission: RE | Admit: 2023-11-04 | Discharge: 2023-11-04 | Disposition: A | Discharge status: Home / Self Care | Source: Ambulatory Visit

## 2023-11-04 VITALS — BP 109/67 | HR 81 | Temp 98.1°F | Resp 18

## 2023-11-04 DIAGNOSIS — S39012A Strain of muscle, fascia and tendon of lower back, initial encounter: Secondary | ICD-10-CM

## 2023-11-04 MED ORDER — BACLOFEN 10 MG PO TABS: 10.0000 mg | ORAL_TABLET | Freq: Two times a day (BID) | ORAL | 0 refills | Status: AC

## 2023-11-04 MED ORDER — KETOROLAC TROMETHAMINE 30 MG/ML IJ SOLN
30.0000 mg | Freq: Once | INTRAMUSCULAR | Status: AC
  Administered 2023-11-04: 30 mg via INTRAMUSCULAR

## 2023-11-04 MED ORDER — DICLOFENAC SODIUM 50 MG PO TBEC: 50.0000 mg | DELAYED_RELEASE_TABLET | Freq: Two times a day (BID) | ORAL | 1 refills | Status: AC

## 2023-11-04 NOTE — ED Provider Notes (Signed)
 [facility]  Note:  This document was prepared using Dragon voice recognition software and may include unintentional dictation errors.  MRN: [id number] DOB: July 15, 1955  Subjective:   Sandra Reese is a 68 y.o. female presenting for bilateral lower back pain x 1.5 weeks.  Patient reports that she was working in the garden when she fell landing on her left hip on the sidewalk.  Patient reports since that time she has had lower back pain and bruising to the left hip.  Patient states that left hip pain has subsided but she is still having bilateral lower back pain.  Patient has chronic history of lower back pain and degenerative disc disease.  Patient denies hitting her head or any loss of consciousness.  Patient reports that she is still having severe pain with lateral and rotational movement.  Patient has been taking all of her previously prescribed pain reliever and muscle relaxants with no improvement.  Patient states that she was given a prescription on paper by her physician back home lumbar x-ray but was unable to complete exam prior to leaving to come visit her daughter here in [place].  Patient states that she expresses some concern for lumbar vertebral fracture due to fall.  No current facility-administered medications for this encounter.  Current Outpatient Medications:    baclofen (LIORESAL) 10 MG tablet, Take 1 tablet (10 mg total) by mouth 2 (two) times daily., Disp: 30 each, Rfl: 0   diclofenac (VOLTAREN) 50 MG EC tablet, Take 1 tablet (50 mg total) by mouth 2 (two) times daily., Disp: 30 tablet, Rfl: 1   atenolol (TENORMIN) 50 MG tablet, Take 50 mg by mouth as directed. Every other day, Disp: , Rfl:    atenolol-chlorthalidone (TENORETIC) 50-25 MG tablet, Take 1 tablet by mouth as directed. Every other day, Disp: , Rfl:    estradiol (ESTRACE) 1 MG tablet, Take 1 mg by mouth daily., Disp: , Rfl:    levothyroxine (SYNTHROID, LEVOTHROID) 175 MCG tablet, Take 175 mcg by  mouth daily before breakfast., Disp: , Rfl:    oxyCODONE -acetaminophen  (PERCOCET) 10-325 MG tablet, Take 1 tablet by mouth every 6 (six) hours as needed for pain. , Disp: , Rfl:    pregabalin (LYRICA) 150 MG capsule, Take 150 mg by mouth 3 (three) times daily., Disp: , Rfl:    rosuvastatin (CRESTOR) 10 MG tablet, Take 10 mg by mouth 2 (two) times a week., Disp: , Rfl:    traZODone (DESYREL) 50 MG tablet, Take 50-100 mg by mouth at bedtime., Disp: , Rfl:    Allergies  Allergen Reactions   Lisinopril     Other reaction(s): Swelling   Latex     Other reaction(s): Itching, Rash   Metronidazole Itching    Past Medical History:  Diagnosis Date   Degenerative arthritis    Diabetes mellitus without complication (HCC)    Hypertension    Thyroid disease      History reviewed. No pertinent surgical history.  History reviewed. No pertinent family history.  Social History   Tobacco Use   Smoking status: Never   Smokeless tobacco: Never  Substance Use Topics   Alcohol use: Never   Drug use: Never    ROS Refer to HPI for ROS details.  Objective:   Vitals: BP 109/67 (BP Location: Right Arm)   Pulse 81   Temp 98.1 F (36.7 C) (Oral)   Resp 18   SpO2 98%   Physical Exam Vitals and nursing note reviewed.  Constitutional:  General: She is not in acute distress.    Appearance: Normal appearance. She is well-developed. She is not ill-appearing or toxic-appearing.  HENT:     Head: Normocephalic and atraumatic.  Cardiovascular:     Rate and Rhythm: Normal rate.  Pulmonary:     Effort: Pulmonary effort is normal. No respiratory distress.  Musculoskeletal:     Lumbar back: Spasms, tenderness and bony tenderness present. No swelling or deformity. Decreased range of motion. Negative right straight leg raise test and negative left straight leg raise test.  Skin:    General: Skin is warm and dry.  Neurological:     General: No focal deficit present.     Mental Status: She is  alert and oriented to person, place, and time.  Psychiatric:        Mood and Affect: Mood normal.        Behavior: Behavior normal.     Procedures  No results found for this or any previous visit (from the past 24 hours).  DG Lumbar Spine Complete Result Date: 11/04/2023 CLINICAL DATA:  Lower back pain after fall 2 weeks ago. EXAM: LUMBAR SPINE - COMPLETE 4+ VIEW COMPARISON:  None Available. FINDINGS: Mild grade 1 anterolisthesis of L3-4 and L4-5 is noted secondary to posterior facet joint hypertrophy. Minimal degenerative disc disease is noted at L3-4, L4-5 and L5-S1. No fracture is noted. IMPRESSION: Multilevel degenerative changes as described above. No acute abnormality seen. Electronically Signed   By: Lynwood Landy Raddle M.D.   On: 11/04/2023 16:15     Assessment and Plan :     Discharge Instructions       1. Lumbar strain, initial encounter (Primary) - ketorolac (TORADOL) 30 MG/ML injection 30 mg given in UC for acute lumbosacral pain - DG Lumbar Spine Complete x-ray completed today shows no acute fracture or dislocation, moderate multilevel degenerative changes noted. - diclofenac (VOLTAREN) 50 MG EC tablet; Take 1 tablet (50 mg total) by mouth 2 (two) times daily.  Dispense: 30 tablet; Refill: 1 - baclofen (LIORESAL) 10 MG tablet; Take 1 tablet (10 mg total) by mouth 2 (two) times daily.  Dispense: 30 each; Refill: 0 -Continue to monitor symptoms for any change in severity if there is any escalation of current symptoms or development of new symptoms follow-up in ER for further evaluation and management.      Kenna Seward B Qiana Landgrebe   Kimanh Templeman, [place], [place] 11/04/23 1627

## 2023-11-04 NOTE — Discharge Instructions (Addendum)
  1. Lumbar strain, initial encounter (Primary) - ketorolac (TORADOL) 30 MG/ML injection 30 mg given in UC for acute lumbosacral pain - DG Lumbar Spine Complete x-ray completed today shows no acute fracture or dislocation, moderate multilevel degenerative changes noted. - diclofenac (VOLTAREN) 50 MG EC tablet; Take 1 tablet (50 mg total) by mouth 2 (two) times daily.  Dispense: 30 tablet; Refill: 1 - baclofen (LIORESAL) 10 MG tablet; Take 1 tablet (10 mg total) by mouth 2 (two) times daily.  Dispense: 30 each; Refill: 0 -Continue to monitor symptoms for any change in severity if there is any escalation of current symptoms or development of new symptoms follow-up in ER for further evaluation and management.

## 2023-11-04 NOTE — ED Triage Notes (Addendum)
 Pt states she fell about 1.5 week  ago. Presents today due to lower back pain that is severe with movement.   She also has bruising on left thigh. Denies hitting head.
